# Patient Record
Sex: Male | Born: 1980
Health system: Southern US, Community
[De-identification: ages and names within clinical notes are randomized; demographics above are authoritative.]

## PROBLEM LIST (undated history)

## (undated) DIAGNOSIS — Q431 Hirschsprung's disease: Secondary | ICD-10-CM

## (undated) HISTORY — PX: HYDROCELE EXCISION: SHX482

## (undated) HISTORY — PX: WRIST SURGERY: SHX841

## (undated) HISTORY — PX: HAND SURGERY: SHX662

---

## 2017-12-28 ENCOUNTER — Other Ambulatory Visit: Payer: Self-pay | Admitting: Orthopedic Surgery

## 2017-12-28 DIAGNOSIS — M25562 Pain in left knee: Secondary | ICD-10-CM

## 2018-01-11 ENCOUNTER — Ambulatory Visit
Admission: RE | Admit: 2018-01-11 | Discharge: 2018-01-11 | Disposition: A | Discharge status: Home / Self Care | Payer: BLUE CROSS/BLUE SHIELD | Source: Ambulatory Visit | Attending: Orthopedic Surgery | Admitting: Orthopedic Surgery

## 2018-01-11 DIAGNOSIS — M25562 Pain in left knee: Secondary | ICD-10-CM

## 2020-03-23 ENCOUNTER — Emergency Department (HOSPITAL_COMMUNITY)
Admission: EM | Admit: 2020-03-23 | Discharge: 2020-03-23 | Disposition: A | Discharge status: Home / Self Care | Payer: BC Managed Care – PPO | Attending: Emergency Medicine | Admitting: Emergency Medicine

## 2020-03-23 ENCOUNTER — Encounter (HOSPITAL_COMMUNITY): Payer: Self-pay | Admitting: Emergency Medicine

## 2020-03-23 ENCOUNTER — Other Ambulatory Visit: Payer: Self-pay

## 2020-03-23 DIAGNOSIS — R2241 Localized swelling, mass and lump, right lower limb: Secondary | ICD-10-CM | POA: Diagnosis not present

## 2020-03-23 DIAGNOSIS — L03115 Cellulitis of right lower limb: Secondary | ICD-10-CM | POA: Insufficient documentation

## 2020-03-23 HISTORY — DX: Hirschsprung's disease: Q43.1

## 2020-03-23 MED ORDER — CLINDAMYCIN HCL 150 MG PO CAPS
300.0000 mg | ORAL_CAPSULE | Freq: Once | ORAL | Status: AC
  Administered 2020-03-23: 300 mg via ORAL
  Filled 2020-03-23: qty 2

## 2020-03-23 MED ORDER — CLINDAMYCIN HCL 300 MG PO CAPS: 300.0000 mg | ORAL_CAPSULE | Freq: Four times a day (QID) | ORAL | 0 refills | Status: AC

## 2020-03-23 NOTE — ED Triage Notes (Signed)
Pt complains of swelling, redness and pain to his right lower leg that began yesterday.

## 2020-03-23 NOTE — Discharge Instructions (Signed)
I have drawn a line on your leg, if the redness is rapidly spreading past this line or if you should develop increasing pain swelling fevers or any other severe or worsening symptoms return to the emergency department immediately.  We have given you the first dose of antibiotics here, please start taking the medication in the morning 4 times a day for the next 10 days.  If you should develop any worsening swelling or shortness of breath you will need to be evaluated for a blood clot however your examination this evening reveals that this is far more likely to be a skin infection from staph

## 2020-03-23 NOTE — ED Provider Notes (Signed)
[facility] EMERGENCY DEPARTMENT Provider Note   CSN: [id number] Arrival date & time: 03/23/20  2124     History Chief Complaint  Patient presents with   Leg Pain    Ronald Velasquez is a 39 y.o. male.  HPI   This patient is a 39 year old male, denies history of diabetes or immunocompromise.  He works as a IT trainer driving good distances frequently.  Last night he noticed a spot on his ankle which was red and painful, he started to have subjective fevers and chills and this morning woke up with progressive redness which by this evening is spread from his ankle up towards his knee.  He has pain with walking, pain with touching it, no associated nausea or vomiting.  He has not taken any specific medications for it, he was primarily worried about a blood clot  Past Medical History:  Diagnosis Date   Hirschsprung's disease     There are no problems to display for this patient.   Past Surgical History:  Procedure Laterality Date   HAND SURGERY Left    HYDROCELE EXCISION     WRIST SURGERY Left        History reviewed. No pertinent family history.  Social History   Tobacco Use   Smoking status: Never Smoker   Smokeless tobacco: Never Used  Vaping Use   Vaping Use: Never used  Substance Use Topics   Alcohol use: Not Currently   Drug use: Never    Home Medications Prior to Admission medications   Medication Sig Start Date End Date Taking? Authorizing Provider  clindamycin (CLEOCIN) 300 MG capsule Take 1 capsule (300 mg total) by mouth 4 (four) times daily for 10 days. May dispense as 150mg  capsules 03/23/20 04/02/20  04/04/20, MD    Allergies    Patient has no allergy information on record.  Review of Systems   Review of Systems  Constitutional: Positive for chills and fever.  Cardiovascular: Positive for leg swelling.  Skin: Positive for rash.  Neurological: Negative for weakness and numbness.    Physical Exam Updated Vital Signs BP 132/82 (BP  Location: Right Arm)    Pulse (!) 102    Temp 99.5 F (37.5 C) (Oral)    Resp 18    Ht 1.829 m (6')    Wt (!) 181.4 kg    SpO2 97%    BMI 54.25 kg/m   Physical Exam Vitals and nursing note reviewed.  Constitutional:      General: He is not in acute distress.    Appearance: He is well-developed.  HENT:     Head: Normocephalic and atraumatic.  Eyes:     General: No scleral icterus.       Right eye: No discharge.        Left eye: No discharge.     Conjunctiva/sclera: Conjunctivae normal.  Pulmonary:     Effort: Pulmonary effort is normal.  Musculoskeletal:     Right lower leg: Edema present.     Left lower leg: Edema present.  Skin:    Findings: Erythema and rash present.     Comments: Right lower extremity has mild erythema at the knee which darkens the lower you go on the leg towards the ankle where it is a darker red.  It is warm to the touch and mildly tender to the touch, normal pulses of the foot  Neurological:     Mental Status: He is alert.     Coordination: Coordination normal.  ED Results / Procedures / Treatments   Labs (all labs ordered are listed, but only abnormal results are displayed) Labs Reviewed - No data to display  EKG None  Radiology No results found.  Procedures Procedures (including critical care time)  Medications Ordered in ED Medications  clindamycin (CLEOCIN) capsule 300 mg (has no administration in time range)    ED Course  I have reviewed the triage vital signs and the nursing notes.  Pertinent labs & imaging results that were available during my care of the patient were reviewed by me and considered in my medical decision making (see chart for details).    MDM Rules/Calculators/A&P                          The patient is a borderline tachycardia, no objective fever, normal blood pressure.  He is not immunocompromise however he is obese and has edema predisposing him to cellulitis which I believe he has.  Though he is also at  risk of DVT I think that his fevers chills and redness is far more consistent with having cellulitis and we will treat him with clindamycin.  He understands that he will return for worsening symptoms and to follow-up for an ultrasound if no better.  The first dose of medicine given in the emergency department.  Final Clinical Impression(s) / ED Diagnoses Final diagnoses:  Cellulitis of right lower extremity    Rx / DC Orders ED Discharge Orders         Ordered    clindamycin (CLEOCIN) 300 MG capsule  4 times daily        03/23/20 2200           Eber Hong, MD 03/23/20 2201

## 2020-04-01 ENCOUNTER — Other Ambulatory Visit: Payer: Self-pay

## 2020-04-01 ENCOUNTER — Emergency Department (HOSPITAL_COMMUNITY)
Admission: EM | Admit: 2020-04-01 | Discharge: 2020-04-01 | Disposition: A | Discharge status: Home / Self Care | Payer: BC Managed Care – PPO | Attending: Emergency Medicine | Admitting: Emergency Medicine

## 2020-04-01 ENCOUNTER — Encounter (HOSPITAL_COMMUNITY): Payer: Self-pay | Admitting: *Deleted

## 2020-04-01 DIAGNOSIS — M7989 Other specified soft tissue disorders: Secondary | ICD-10-CM | POA: Insufficient documentation

## 2020-04-01 DIAGNOSIS — T7840XA Allergy, unspecified, initial encounter: Secondary | ICD-10-CM | POA: Insufficient documentation

## 2020-04-01 DIAGNOSIS — R600 Localized edema: Secondary | ICD-10-CM

## 2020-04-01 DIAGNOSIS — R21 Rash and other nonspecific skin eruption: Secondary | ICD-10-CM | POA: Diagnosis not present

## 2020-04-01 MED ORDER — PREDNISONE 50 MG PO TABS
60.0000 mg | ORAL_TABLET | Freq: Once | ORAL | Status: AC
  Administered 2020-04-01: 60 mg via ORAL
  Filled 2020-04-01: qty 1

## 2020-04-01 MED ORDER — PREDNISONE 10 MG PO TABS: 20.0000 mg | ORAL_TABLET | Freq: Every day | ORAL | 0 refills | Status: AC

## 2020-04-01 NOTE — ED Triage Notes (Addendum)
Pt c/o right lower leg swelling and redness that started on 03/22/20. Pt was seen at [facility] on 03/23/20 and diagnosed with staph and given antibiotics to take. Pt has taken antibiotics correctly and will finish them tomorrow. Pt reports an itchy, bump like rash started this morning on RLE and also on his chest. Pt reports the swelling and redness on his RLE is slightly better because it was all the way up to his knee.   Pt was seen at [facility] today and the provider wanted to do an [place] of his RLE to r/o DVT, but they did not have [place] machine available so they sent pt to ED for further evaluation.

## 2020-04-01 NOTE — ED Provider Notes (Signed)
[facility] EMERGENCY DEPARTMENT Provider Note   CSN: [id number] Arrival date & time: 04/01/20  1724     History Chief Complaint  Patient presents with  . Leg Swelling  . Rash    Ronald Velasquez is a 39 y.o. male.  Patient was seen on October 5 with cellulitis to right lower leg.  He has been taking clindamycin but has developed a rash to his leg his chest and his face.  He was seen at an urgent care and they sent him here for possible DVT  The history is provided by the patient and medical records. No language interpreter was used.  Rash Location: Face legs chest. Quality: itchiness   Severity:  Moderate Onset quality:  Sudden Timing:  Constant Progression:  Worsening Chronicity:  New Context: not animal contact   Associated symptoms: no abdominal pain, no diarrhea, no fatigue and no headaches        Past Medical History:  Diagnosis Date  . Hirschsprung's disease     There are no problems to display for this patient.   Past Surgical History:  Procedure Laterality Date  . HAND SURGERY Left   . HYDROCELE EXCISION    . WRIST SURGERY Left        No family history on file.  Social History   Tobacco Use  . Smoking status: Never Smoker  . Smokeless tobacco: Never Used  Vaping Use  . Vaping Use: Never used  Substance Use Topics  . Alcohol use: Not Currently  . Drug use: Never    Home Medications Prior to Admission medications   Medication Sig Start Date End Date Taking? Authorizing Provider  clindamycin (CLEOCIN) 300 MG capsule Take 1 capsule (300 mg total) by mouth 4 (four) times daily for 10 days. May dispense as 150mg  capsules 03/23/20 04/02/20  04/04/20, MD  predniSONE (DELTASONE) 10 MG tablet Take 2 tablets (20 mg total) by mouth daily. 04/01/20   04/03/20, MD    Allergies    Patient has no known allergies.  Review of Systems   Review of Systems  Constitutional: Negative for appetite change and fatigue.  HENT: Negative for  congestion, ear discharge and sinus pressure.   Eyes: Negative for discharge.  Respiratory: Negative for cough.   Cardiovascular: Negative for chest pain.  Gastrointestinal: Negative for abdominal pain and diarrhea.  Genitourinary: Negative for frequency and hematuria.  Musculoskeletal: Negative for back pain.  Skin: Positive for rash.  Neurological: Negative for seizures and headaches.  Psychiatric/Behavioral: Negative for hallucinations.    Physical Exam Updated Vital Signs BP 119/77 (BP Location: Right Arm)   Pulse 81   Temp 98.3 F (36.8 C) (Oral)   Resp 18   Ht 6' (1.829 m)   Wt (!) 181.4 kg   SpO2 97%   BMI 54.25 kg/m   Physical Exam Vitals and nursing note reviewed.  Constitutional:      Appearance: He is well-developed.  HENT:     Head: Normocephalic.     Nose: Nose normal.  Eyes:     General: No scleral icterus.    Conjunctiva/sclera: Conjunctivae normal.  Neck:     Thyroid: No thyromegaly.  Cardiovascular:     Rate and Rhythm: Normal rate and regular rhythm.     Heart sounds: No murmur heard.  No friction rub. No gallop.   Pulmonary:     Breath sounds: No stridor. No wheezing or rales.  Chest:     Chest wall: No tenderness.  Abdominal:  General: There is no distension.     Tenderness: There is no abdominal tenderness. There is no rebound.  Musculoskeletal:     Cervical back: Neck supple.     Comments: Swelling mild tenderness to The Right Leg  Lymphadenopathy:     Cervical: No cervical adenopathy.  Skin:    Findings: Rash present. No erythema.     Comments: Patient has a rash to his face his chest and his right leg.  It appears macular papular and possible allergic  Neurological:     Mental Status: He is alert and oriented to person, place, and time.     Motor: No abnormal muscle tone.     Coordination: Coordination normal.  Psychiatric:        Behavior: Behavior normal.     ED Results / Procedures / Treatments   Labs (all labs ordered  are listed, but only abnormal results are displayed) Labs Reviewed - No data to display  EKG None  Radiology No results found.  Procedures Procedures (including critical care time)  Medications Ordered in ED Medications  predniSONE (DELTASONE) tablet 60 mg (has no administration in time range)    ED Course  I have reviewed the triage vital signs and the nursing notes.  Pertinent labs & imaging results that were available during my care of the patient were reviewed by me and considered in my medical decision making (see chart for details).    MDM Rules/Calculators/A&P                          Patient with allergic reaction to his clindamycin.  He also has swelling to his right leg but it appears that the cellulitis has improved.  He will stop the clindamycin which he only had 1 day left on and he is placed on prednisone for a few days and will get an ultrasound to rule out DVT tomorrow      This patient presents to the ED for concern of rash, this involves an extensive number of treatment options, and is a complaint that carries with it a high risk of complications and morbidity.  The differential diagnosis includes allergic reaction   Lab Tests:   Medicines ordered:   I ordered medication prednisone  Imaging Studies ordered:   Patient get an ultrasound of the right lower leg rule out DVT  Additional history obtained:   Additional history obtained from records  Previous records obtained and reviewed.  Consultations Obtained:    findings  Reevaluation:  After the interventions stated above, I reevaluated the patient and found no change  Critical Interventions:  .   Final Clinical Impression(s) / ED Diagnoses Final diagnoses:  Leg swelling  Allergic reaction, initial encounter    Rx / DC Orders ED Discharge Orders         Ordered    predniSONE (DELTASONE) 10 MG tablet  Daily        04/01/20 1810    US Venous Img Lower Unilateral Right         04/01/20 1811           Bethann Berkshire, MD 04/01/20 1818

## 2020-04-01 NOTE — Discharge Instructions (Addendum)
Radiology department will contact you to have you come back and get an ultrasound of your leg.  Keep your leg elevated stop taking the clindamycin it appears you are allergic to it.  We will place you on prednisone.  After you have your ultrasound done,  the Doctor who discusses the results with you can decide whether you need antibiotics again

## 2020-04-02 ENCOUNTER — Emergency Department (HOSPITAL_COMMUNITY)
Admission: RE | Admit: 2020-04-02 | Discharge: 2020-04-02 | Disposition: A | Discharge status: Home / Self Care | Payer: BC Managed Care – PPO | Source: Ambulatory Visit | Attending: Emergency Medicine | Admitting: Emergency Medicine

## 2020-04-02 DIAGNOSIS — M7989 Other specified soft tissue disorders: Secondary | ICD-10-CM | POA: Diagnosis not present

## 2020-04-02 DIAGNOSIS — R21 Rash and other nonspecific skin eruption: Secondary | ICD-10-CM | POA: Diagnosis not present

## 2020-04-02 DIAGNOSIS — R600 Localized edema: Secondary | ICD-10-CM

## 2020-04-02 NOTE — ED Provider Notes (Signed)
Blood pressure 119/77, pulse 81, temperature 98.3 F (36.8 C), temperature source Oral, resp. rate 18, height 6' (1.829 m), weight (!) 181.4 kg, SpO2 97 %.  In short, Ronald Velasquez is a 39 y.o. male with a chief complaint of Leg Swelling and Rash .  Refer to the original H&P for additional details.  Patient returns this morning for ultrasound of the right lower extremity.  I have reviewed the radiology interpretation which is negative for DVT.  Patient to be discharged with plan per prior EDP last night.     Maia Plan, MD 04/02/20 1046

## 2020-04-02 NOTE — ED Notes (Addendum)
EDP went to talk with pt on 04/02/20 and not found in waiting room. Pt called and informed of negative ultrasound result at 1243.

## 2020-04-27 DIAGNOSIS — R609 Edema, unspecified: Secondary | ICD-10-CM | POA: Diagnosis not present

## 2020-12-24 DIAGNOSIS — L0390 Cellulitis, unspecified: Secondary | ICD-10-CM | POA: Diagnosis not present

## 2020-12-27 DIAGNOSIS — L03115 Cellulitis of right lower limb: Secondary | ICD-10-CM | POA: Diagnosis not present

## 2021-01-13 ENCOUNTER — Other Ambulatory Visit (INDEPENDENT_AMBULATORY_CARE_PROVIDER_SITE_OTHER): Payer: Self-pay | Admitting: Nurse Practitioner

## 2021-01-13 DIAGNOSIS — L03115 Cellulitis of right lower limb: Secondary | ICD-10-CM | POA: Diagnosis not present

## 2021-01-13 DIAGNOSIS — L03116 Cellulitis of left lower limb: Secondary | ICD-10-CM

## 2021-01-13 DIAGNOSIS — M7989 Other specified soft tissue disorders: Secondary | ICD-10-CM

## 2021-01-14 ENCOUNTER — Encounter (INDEPENDENT_AMBULATORY_CARE_PROVIDER_SITE_OTHER): Payer: Self-pay | Admitting: Nurse Practitioner

## 2021-01-14 ENCOUNTER — Ambulatory Visit (INDEPENDENT_AMBULATORY_CARE_PROVIDER_SITE_OTHER): Payer: BC Managed Care – PPO

## 2021-01-14 ENCOUNTER — Ambulatory Visit (INDEPENDENT_AMBULATORY_CARE_PROVIDER_SITE_OTHER): Payer: BC Managed Care – PPO | Admitting: Nurse Practitioner

## 2021-01-14 ENCOUNTER — Other Ambulatory Visit: Payer: Self-pay

## 2021-01-14 VITALS — BP 116/81 | HR 85 | Resp 16 | Ht 72.0 in | Wt >= 6400 oz

## 2021-01-14 DIAGNOSIS — I8311 Varicose veins of right lower extremity with inflammation: Secondary | ICD-10-CM | POA: Diagnosis not present

## 2021-01-14 DIAGNOSIS — R600 Localized edema: Secondary | ICD-10-CM

## 2021-01-14 DIAGNOSIS — L03115 Cellulitis of right lower limb: Secondary | ICD-10-CM

## 2021-01-14 DIAGNOSIS — M7989 Other specified soft tissue disorders: Secondary | ICD-10-CM

## 2021-01-14 DIAGNOSIS — I890 Lymphedema, not elsewhere classified: Secondary | ICD-10-CM

## 2021-01-14 NOTE — Progress Notes (Signed)
Subjective:    Patient ID: Ronald Velasquez, male    DOB: 06-Dec-1980, 40 y.o.   MRN: [id number] Chief Complaint  Patient presents with   Follow-up    ultrasound   New Patient (Initial Visit)    Ultrasound recurrent swelling    Ronald Velasquez is a 40 year old male that presents today as a referral from [place], NP due to recurrent right lower extremity leg swelling and cellulitis.  The patient notes that the leg swelling and cellulitis began about a year and a half ago.  Since that time he has had multiple recurrences.  The most recent episode was last Wednesday.  He describes some burning discomfort in his lower extremity in addition to worsening redness and swelling.  Even before the cellulitis episodes he had been having ongoing swelling in his right lower extremity.  He recently began wearing medical grade compression stockings and they have helped with the swelling somewhat but they have not seem to prevent this recurrence of cellulitis.  The patient does have a previous history of ankle injuries.  He denies any fevers or chills.  There are no open wounds or ulcerations.  Today noninvasive studies show no evidence of DVT or superficial thrombophlebitis.  There is no evidence of superficial venous reflux in the great saphenous vein.  There is no evidence of deep venous insufficiency.  The patient does have reflux in the small saphenous vein.  It is also noted that there are multiple enlarged lymph nodes in the right groin area   Review of Systems  Cardiovascular:  Positive for leg swelling.  Skin:  Positive for color change.  All other systems reviewed and are negative.     Objective:   Physical Exam Vitals reviewed.  Constitutional:      Appearance: He is obese.  HENT:     Head: Normocephalic.  Cardiovascular:     Rate and Rhythm: Normal rate.  Pulmonary:     Effort: Pulmonary effort is normal.  Musculoskeletal:     Right lower leg: 2+ Edema present.     Left lower leg: 1+ Edema  present.  Skin:      Neurological:     Mental Status: He is alert and oriented to person, place, and time.  Psychiatric:        Mood and Affect: Mood normal.        Behavior: Behavior normal.        Thought Content: Thought content normal.        Judgment: Judgment normal.   BP 116/81 (BP Location: Right Arm)   Pulse 85   Resp 16   Ht 6' (1.829 m)   Wt (!) 406 lb (184.2 kg)   BMI 55.06 kg/m   Past Medical History:  Diagnosis Date   Hirschsprung's disease     Social History   Socioeconomic History   Marital status: Single    Spouse name: Not on file   Number of children: Not on file   Years of education: Not on file   Highest education level: Not on file  Occupational History   Not on file  Tobacco Use   Smoking status: Never   Smokeless tobacco: Never  Vaping Use   Vaping Use: Never used  Substance and Sexual Activity   Alcohol use: Not Currently   Drug use: Never   Sexual activity: Not on file  Other Topics Concern   Not on file  Social History Narrative   Not on file   Social  Determinants of Health   Financial Resource Strain: Not on file  Food Insecurity: Not on file  Transportation Needs: Not on file  Physical Activity: Not on file  Stress: Not on file  Social Connections: Not on file  Intimate Partner Violence: Not on file    Past Surgical History:  Procedure Laterality Date   HAND SURGERY Left    HYDROCELE EXCISION     WRIST SURGERY Left     Family History  Problem Relation Age of Onset   Heart attack Mother    Diabetes Maternal Grandmother     Allergies  Allergen Reactions   Clindamycin Rash    No flowsheet data found.    CMP  No results found for: NA, K, CL, CO2, GLUCOSE, BUN, CREATININE, CALCIUM, PROT, ALBUMIN, AST, ALT, ALKPHOS, BILITOT, GFRNONAA, GFRAA   No results found.     Assessment & Plan:   1. Varicose veins of right lower extremity with inflammation The patient does have evidence of superficial venous reflux  in his small saphenous vein.  The venous reflux may be worsening the swelling but a larger component may be the underlying lymphedema due to recurrent cellulitis and edema.  We will try to get the patient swelling under control for discussing possible intervention or if it would be of use.  2. Lymphedema I suspect that a large part of the patient's recurrent cellulitis is underlying lymphedema from longstanding issues with swelling.  In order to try to help heal the cellulitis as well as reduce the patient's leg swelling we will place him in Unna wraps today.  These wraps to be changed on a weekly basis drainage permitting.  We will plan on reevaluating his progress with the swelling and cellulitis in 4 weeks.  In the interim the patient is advised to elevate his lower extremities.  Ideally it would be above heart level but at least level with the ground.  The patient should also try to achieve 20 to 30 minutes of activity at least 4 days a week to help with swelling as well.  Following the patient's wound wraps we will evaluate progress of swelling and determine if a lymphedema pump may be helpful to the patient as well.   Current Outpatient Medications on File Prior to Visit  Medication Sig Dispense Refill   predniSONE (DELTASONE) 10 MG tablet Take 2 tablets (20 mg total) by mouth daily. (Patient not taking: Reported on 01/14/2021) 10 tablet 0   No current facility-administered medications on file prior to visit.    There are no Patient Instructions on file for this visit. No follow-ups on file.   Georgiana Spinner, NP

## 2021-01-20 DIAGNOSIS — Z79899 Other long term (current) drug therapy: Secondary | ICD-10-CM | POA: Diagnosis not present

## 2021-01-20 DIAGNOSIS — Z13220 Encounter for screening for lipoid disorders: Secondary | ICD-10-CM | POA: Diagnosis not present

## 2021-01-20 DIAGNOSIS — Z131 Encounter for screening for diabetes mellitus: Secondary | ICD-10-CM | POA: Diagnosis not present

## 2021-01-21 ENCOUNTER — Ambulatory Visit (INDEPENDENT_AMBULATORY_CARE_PROVIDER_SITE_OTHER): Payer: BC Managed Care – PPO | Admitting: Nurse Practitioner

## 2021-01-21 ENCOUNTER — Other Ambulatory Visit: Payer: Self-pay

## 2021-01-21 VITALS — BP 118/81 | HR 73 | Ht 72.0 in | Wt >= 6400 oz

## 2021-01-21 DIAGNOSIS — I890 Lymphedema, not elsewhere classified: Secondary | ICD-10-CM

## 2021-01-21 NOTE — Progress Notes (Signed)
History of Present Illness  There is no documented history at this time  Assessments & Plan   There are no diagnoses linked to this encounter.    Additional instructions  Subjective:  Patient presents with venous ulcer of the Right lower extremity.    Procedure:  3 layer unna wrap was placed Right lower extremity.   Plan:   Follow up in one week.   

## 2021-01-28 ENCOUNTER — Encounter (INDEPENDENT_AMBULATORY_CARE_PROVIDER_SITE_OTHER): Payer: Self-pay

## 2021-01-28 ENCOUNTER — Other Ambulatory Visit: Payer: Self-pay

## 2021-01-28 ENCOUNTER — Ambulatory Visit (INDEPENDENT_AMBULATORY_CARE_PROVIDER_SITE_OTHER): Payer: BC Managed Care – PPO | Admitting: Nurse Practitioner

## 2021-01-28 VITALS — BP 142/86 | HR 76 | Resp 16 | Wt >= 6400 oz

## 2021-01-28 DIAGNOSIS — L03115 Cellulitis of right lower limb: Secondary | ICD-10-CM

## 2021-01-28 NOTE — Progress Notes (Signed)
History of Present Illness  There is no documented history at this time  Assessments & Plan   There are no diagnoses linked to this encounter.    Additional instructions  Subjective:  Patient presents with venous ulcer of the Right lower extremity.    Procedure:  3 layer unna wrap was placed Right lower extremity.   Plan:   Follow up in one week.   

## 2021-01-29 ENCOUNTER — Encounter (INDEPENDENT_AMBULATORY_CARE_PROVIDER_SITE_OTHER): Payer: Self-pay | Admitting: Nurse Practitioner

## 2021-01-30 ENCOUNTER — Encounter (INDEPENDENT_AMBULATORY_CARE_PROVIDER_SITE_OTHER): Payer: Self-pay | Admitting: Nurse Practitioner

## 2021-02-02 DIAGNOSIS — R0789 Other chest pain: Secondary | ICD-10-CM | POA: Diagnosis not present

## 2021-02-02 DIAGNOSIS — Z713 Dietary counseling and surveillance: Secondary | ICD-10-CM | POA: Diagnosis not present

## 2021-02-02 DIAGNOSIS — Z6841 Body Mass Index (BMI) 40.0 and over, adult: Secondary | ICD-10-CM | POA: Diagnosis not present

## 2021-02-02 DIAGNOSIS — R030 Elevated blood-pressure reading, without diagnosis of hypertension: Secondary | ICD-10-CM | POA: Diagnosis not present

## 2021-02-04 ENCOUNTER — Encounter (INDEPENDENT_AMBULATORY_CARE_PROVIDER_SITE_OTHER): Payer: Self-pay | Admitting: Nurse Practitioner

## 2021-02-04 ENCOUNTER — Ambulatory Visit (INDEPENDENT_AMBULATORY_CARE_PROVIDER_SITE_OTHER): Payer: BC Managed Care – PPO | Admitting: Nurse Practitioner

## 2021-02-04 ENCOUNTER — Other Ambulatory Visit: Payer: Self-pay

## 2021-02-04 VITALS — BP 117/81 | HR 76 | Ht 72.0 in | Wt >= 6400 oz

## 2021-02-04 DIAGNOSIS — I890 Lymphedema, not elsewhere classified: Secondary | ICD-10-CM

## 2021-02-04 NOTE — Progress Notes (Signed)
History of Present Illness  There is no documented history at this time  Assessments & Plan   There are no diagnoses linked to this encounter.    Additional instructions  Subjective:  Patient presents with venous ulcer of the Right lower extremity.    Procedure:  3 layer unna wrap was placed Right lower extremity.   Plan:   Follow up in one week.   

## 2021-02-11 ENCOUNTER — Ambulatory Visit (INDEPENDENT_AMBULATORY_CARE_PROVIDER_SITE_OTHER): Payer: BC Managed Care – PPO | Admitting: Nurse Practitioner

## 2021-02-11 ENCOUNTER — Encounter (INDEPENDENT_AMBULATORY_CARE_PROVIDER_SITE_OTHER): Payer: Self-pay | Admitting: Nurse Practitioner

## 2021-02-11 ENCOUNTER — Other Ambulatory Visit: Payer: Self-pay

## 2021-02-11 VITALS — BP 126/80 | HR 83 | Resp 16 | Wt >= 6400 oz

## 2021-02-11 DIAGNOSIS — L03115 Cellulitis of right lower limb: Secondary | ICD-10-CM | POA: Diagnosis not present

## 2021-02-11 DIAGNOSIS — I890 Lymphedema, not elsewhere classified: Secondary | ICD-10-CM | POA: Diagnosis not present

## 2021-02-12 ENCOUNTER — Encounter (INDEPENDENT_AMBULATORY_CARE_PROVIDER_SITE_OTHER): Payer: Self-pay | Admitting: Nurse Practitioner

## 2021-02-12 NOTE — Progress Notes (Signed)
Subjective:    Patient ID: Ronald Velasquez, male    DOB: 1980/06/30, 40 y.o.   MRN: [id number] Chief Complaint  Patient presents with   Follow-up    Ronald Velasquez boot check    Ronald Velasquez is a 40 year old man that returns to the office for followup evaluation regarding leg swelling.  The swelling has improved quite a bit and the pain associated with swelling has decreased substantially. There have not been any interval development of a ulcerations or wounds.  Since the previous visit the patient has been in Unna wraps and this has been very helpful for his swelling and gaining control. The patient also states elevation during the day and exercise is being done too.        Review of Systems  Cardiovascular:  Positive for leg swelling.  Skin:  Negative for wound.  All other systems reviewed and are negative.     Objective:   Physical Exam Vitals reviewed.  HENT:     Head: Normocephalic.  Cardiovascular:     Rate and Rhythm: Normal rate.  Pulmonary:     Effort: Pulmonary effort is normal.  Musculoskeletal:     Right lower leg: No edema.     Left lower leg: No edema.  Skin:    Findings: No erythema.  Neurological:     Mental Status: He is alert and oriented to person, place, and time.  Psychiatric:        Mood and Affect: Mood normal.        Behavior: Behavior normal.        Thought Content: Thought content normal.        Judgment: Judgment normal.    BP 126/80 (BP Location: Right Arm)   Pulse 83   Resp 16   Wt (!) 412 lb (186.9 kg)   BMI 55.88 kg/m   Past Medical History:  Diagnosis Date   Hirschsprung's disease     Social History   Socioeconomic History   Marital status: Single    Spouse name: Not on file   Number of children: Not on file   Years of education: Not on file   Highest education level: Not on file  Occupational History   Not on file  Tobacco Use   Smoking status: Never   Smokeless tobacco: Never  Vaping Use   Vaping Use: Never used   Substance and Sexual Activity   Alcohol use: Not Currently   Drug use: Never   Sexual activity: Not on file  Other Topics Concern   Not on file  Social History Narrative   Not on file   Social Determinants of Health   Financial Resource Strain: Not on file  Food Insecurity: Not on file  Transportation Needs: Not on file  Physical Activity: Not on file  Stress: Not on file  Social Connections: Not on file  Intimate Partner Violence: Not on file    Past Surgical History:  Procedure Laterality Date   HAND SURGERY Left    HYDROCELE EXCISION     WRIST SURGERY Left     Family History  Problem Relation Age of Onset   Heart attack Mother    Diabetes Maternal Grandmother     Allergies  Allergen Reactions   Clindamycin Rash    No flowsheet data found.    CMP  No results found for: NA, K, CL, CO2, GLUCOSE, BUN, CREATININE, CALCIUM, PROT, ALBUMIN, AST, ALT, ALKPHOS, BILITOT, GFRNONAA, GFRAA   No results found.  Assessment & Plan:   1. Lymphedema The patient has done well with Unna wraps.  The swelling is greatly under control and his cellulitis has resolved.  The patient is advised to transition to conservative therapy including use of medical grade compression stockings, elevation and activity.  He is advised to utilize 20 to 30 mmHg compression socks and he should place the socks daily in the morning and remove them at bedtime.  He should not sleep in the socks.  Consideration for a lymph pump will also be made based upon the effectiveness of conservative therapy.  This would help to improve the edema control and prevent sequela such as ulcers and infections   The patient will follow-up in 3 months or sooner if issues should arise. The patient will follow-up with me after the ultrasound.    2. Cellulitis of leg, right This has resolved   Current Outpatient Medications on File Prior to Visit  Medication Sig Dispense Refill   predniSONE (DELTASONE) 10 MG  tablet Take 2 tablets (20 mg total) by mouth daily. (Patient not taking: No sig reported) 10 tablet 0   No current facility-administered medications on file prior to visit.    There are no Patient Instructions on file for this visit. No follow-ups on file.   Georgiana Spinner, NP

## 2021-02-17 DIAGNOSIS — Z7689 Persons encountering health services in other specified circumstances: Secondary | ICD-10-CM | POA: Diagnosis not present

## 2021-03-15 DIAGNOSIS — M6281 Muscle weakness (generalized): Secondary | ICD-10-CM | POA: Diagnosis not present

## 2021-03-15 DIAGNOSIS — Z713 Dietary counseling and surveillance: Secondary | ICD-10-CM | POA: Diagnosis not present

## 2021-03-15 DIAGNOSIS — E669 Obesity, unspecified: Secondary | ICD-10-CM | POA: Diagnosis not present

## 2021-03-15 DIAGNOSIS — R29898 Other symptoms and signs involving the musculoskeletal system: Secondary | ICD-10-CM | POA: Diagnosis not present

## 2021-03-15 DIAGNOSIS — Z6841 Body Mass Index (BMI) 40.0 and over, adult: Secondary | ICD-10-CM | POA: Diagnosis not present

## 2021-03-15 DIAGNOSIS — F4320 Adjustment disorder, unspecified: Secondary | ICD-10-CM | POA: Diagnosis not present

## 2021-05-10 DIAGNOSIS — G4733 Obstructive sleep apnea (adult) (pediatric): Secondary | ICD-10-CM | POA: Diagnosis not present

## 2021-05-17 DIAGNOSIS — G4733 Obstructive sleep apnea (adult) (pediatric): Secondary | ICD-10-CM | POA: Diagnosis not present

## 2021-06-01 DIAGNOSIS — Z01818 Encounter for other preprocedural examination: Secondary | ICD-10-CM | POA: Diagnosis not present

## 2021-06-01 DIAGNOSIS — Z6841 Body Mass Index (BMI) 40.0 and over, adult: Secondary | ICD-10-CM | POA: Diagnosis not present

## 2021-06-06 DIAGNOSIS — Z6841 Body Mass Index (BMI) 40.0 and over, adult: Secondary | ICD-10-CM | POA: Diagnosis not present

## 2021-06-15 DIAGNOSIS — G4733 Obstructive sleep apnea (adult) (pediatric): Secondary | ICD-10-CM | POA: Diagnosis not present

## 2021-07-16 DIAGNOSIS — G4733 Obstructive sleep apnea (adult) (pediatric): Secondary | ICD-10-CM | POA: Diagnosis not present

## 2021-07-20 DIAGNOSIS — Z7689 Persons encountering health services in other specified circumstances: Secondary | ICD-10-CM | POA: Diagnosis not present

## 2021-07-20 DIAGNOSIS — Z01818 Encounter for other preprocedural examination: Secondary | ICD-10-CM | POA: Diagnosis not present

## 2021-08-02 DIAGNOSIS — Z713 Dietary counseling and surveillance: Secondary | ICD-10-CM | POA: Diagnosis not present

## 2021-08-02 DIAGNOSIS — Z6841 Body Mass Index (BMI) 40.0 and over, adult: Secondary | ICD-10-CM | POA: Diagnosis not present

## 2021-08-16 DIAGNOSIS — G4733 Obstructive sleep apnea (adult) (pediatric): Secondary | ICD-10-CM | POA: Diagnosis not present

## 2021-08-26 DIAGNOSIS — E538 Deficiency of other specified B group vitamins: Secondary | ICD-10-CM | POA: Diagnosis not present

## 2021-08-26 DIAGNOSIS — R7989 Other specified abnormal findings of blood chemistry: Secondary | ICD-10-CM | POA: Diagnosis not present

## 2021-08-30 DIAGNOSIS — Z87891 Personal history of nicotine dependence: Secondary | ICD-10-CM | POA: Diagnosis not present

## 2021-08-30 DIAGNOSIS — Z6841 Body Mass Index (BMI) 40.0 and over, adult: Secondary | ICD-10-CM | POA: Diagnosis not present

## 2021-09-13 DIAGNOSIS — G4733 Obstructive sleep apnea (adult) (pediatric): Secondary | ICD-10-CM | POA: Diagnosis not present

## 2021-10-14 DIAGNOSIS — G4733 Obstructive sleep apnea (adult) (pediatric): Secondary | ICD-10-CM | POA: Diagnosis not present

## 2021-10-25 DIAGNOSIS — Z6740 Type O blood, Rh positive: Secondary | ICD-10-CM | POA: Diagnosis not present

## 2021-10-25 DIAGNOSIS — Z87738 Personal history of other specified (corrected) congenital malformations of digestive system: Secondary | ICD-10-CM | POA: Diagnosis not present

## 2021-10-25 DIAGNOSIS — Z7901 Long term (current) use of anticoagulants: Secondary | ICD-10-CM | POA: Diagnosis not present

## 2021-10-25 DIAGNOSIS — R7303 Prediabetes: Secondary | ICD-10-CM | POA: Diagnosis not present

## 2021-10-25 DIAGNOSIS — Z79899 Other long term (current) drug therapy: Secondary | ICD-10-CM | POA: Diagnosis not present

## 2021-10-25 DIAGNOSIS — Z713 Dietary counseling and surveillance: Secondary | ICD-10-CM | POA: Diagnosis not present

## 2021-10-25 DIAGNOSIS — G4733 Obstructive sleep apnea (adult) (pediatric): Secondary | ICD-10-CM | POA: Diagnosis not present

## 2021-10-25 DIAGNOSIS — Z6841 Body Mass Index (BMI) 40.0 and over, adult: Secondary | ICD-10-CM | POA: Diagnosis not present

## 2021-10-25 DIAGNOSIS — E559 Vitamin D deficiency, unspecified: Secondary | ICD-10-CM | POA: Diagnosis not present

## 2021-10-25 DIAGNOSIS — Z48815 Encounter for surgical aftercare following surgery on the digestive system: Secondary | ICD-10-CM | POA: Diagnosis not present

## 2021-10-25 DIAGNOSIS — Z01818 Encounter for other preprocedural examination: Secondary | ICD-10-CM | POA: Diagnosis not present

## 2021-10-25 DIAGNOSIS — Z9884 Bariatric surgery status: Secondary | ICD-10-CM | POA: Diagnosis not present

## 2021-11-10 DIAGNOSIS — Z6841 Body Mass Index (BMI) 40.0 and over, adult: Secondary | ICD-10-CM | POA: Diagnosis not present

## 2021-11-10 DIAGNOSIS — G4733 Obstructive sleep apnea (adult) (pediatric): Secondary | ICD-10-CM | POA: Diagnosis not present

## 2021-11-10 DIAGNOSIS — I10 Essential (primary) hypertension: Secondary | ICD-10-CM | POA: Diagnosis not present

## 2021-11-10 DIAGNOSIS — Z79899 Other long term (current) drug therapy: Secondary | ICD-10-CM | POA: Diagnosis not present

## 2021-11-10 DIAGNOSIS — K660 Peritoneal adhesions (postprocedural) (postinfection): Secondary | ICD-10-CM | POA: Diagnosis not present

## 2021-11-10 DIAGNOSIS — E559 Vitamin D deficiency, unspecified: Secondary | ICD-10-CM | POA: Diagnosis not present

## 2021-11-10 DIAGNOSIS — Z87891 Personal history of nicotine dependence: Secondary | ICD-10-CM | POA: Diagnosis not present

## 2021-11-10 DIAGNOSIS — Z538 Procedure and treatment not carried out for other reasons: Secondary | ICD-10-CM | POA: Diagnosis not present

## 2021-11-10 DIAGNOSIS — Z881 Allergy status to other antibiotic agents status: Secondary | ICD-10-CM | POA: Diagnosis not present

## 2021-11-10 DIAGNOSIS — Z933 Colostomy status: Secondary | ICD-10-CM | POA: Diagnosis not present

## 2021-11-13 DIAGNOSIS — G4733 Obstructive sleep apnea (adult) (pediatric): Secondary | ICD-10-CM | POA: Diagnosis not present

## 2021-12-07 DIAGNOSIS — Z6841 Body Mass Index (BMI) 40.0 and over, adult: Secondary | ICD-10-CM | POA: Diagnosis not present

## 2021-12-07 DIAGNOSIS — Z713 Dietary counseling and surveillance: Secondary | ICD-10-CM | POA: Diagnosis not present

## 2021-12-14 DIAGNOSIS — G4733 Obstructive sleep apnea (adult) (pediatric): Secondary | ICD-10-CM | POA: Diagnosis not present

## 2022-01-06 IMAGING — US US EXTREM LOW VENOUS*R*
1 series · 14 of 24 positions shown · non-contrast
Comparison: None.

CLINICAL DATA: Right leg swelling and rash for the past week.

EXAM:
RIGHT LOWER EXTREMITY VENOUS DOPPLER ULTRASOUND
TECHNIQUE: Gray-scale sonography with compression, as well as color and duplex
ultrasound, were performed to evaluate the deep venous system(s)
from the level of the common femoral vein through the popliteal and
proximal calf veins.

[Series 1: us venous img lower uni right (dvt) · portal-venous · 14 of 40 slices shown]
[im 1/40]
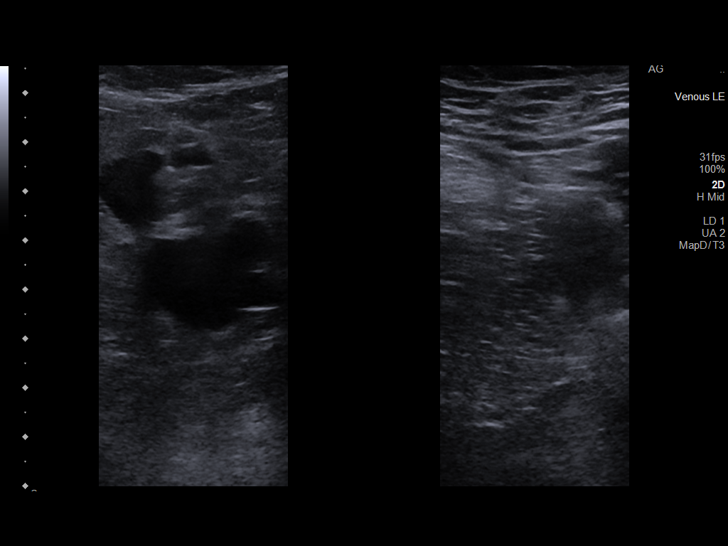
[im 4/40]
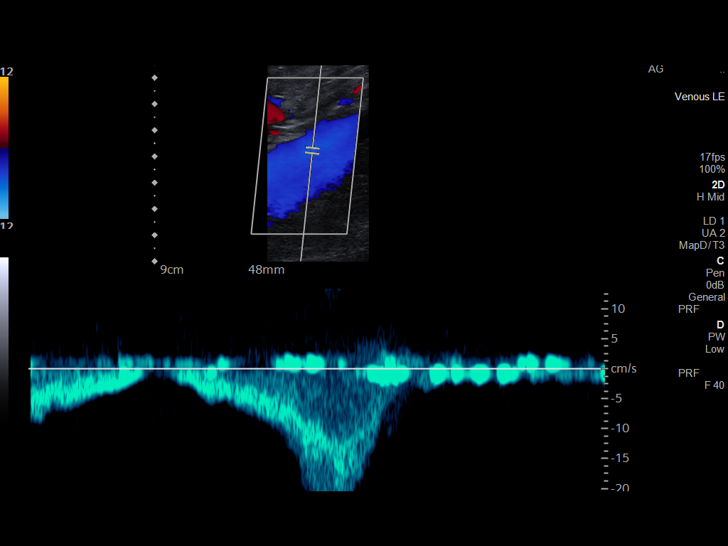
[im 7/40]
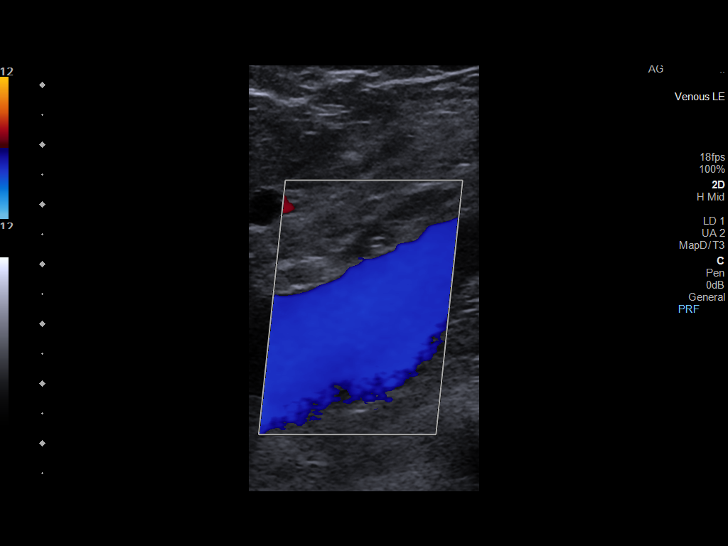
[im 11/40]
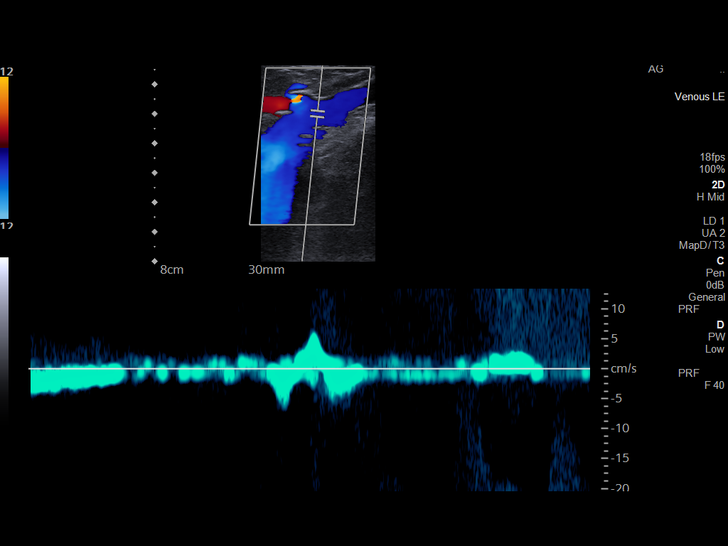
[im 12/40]
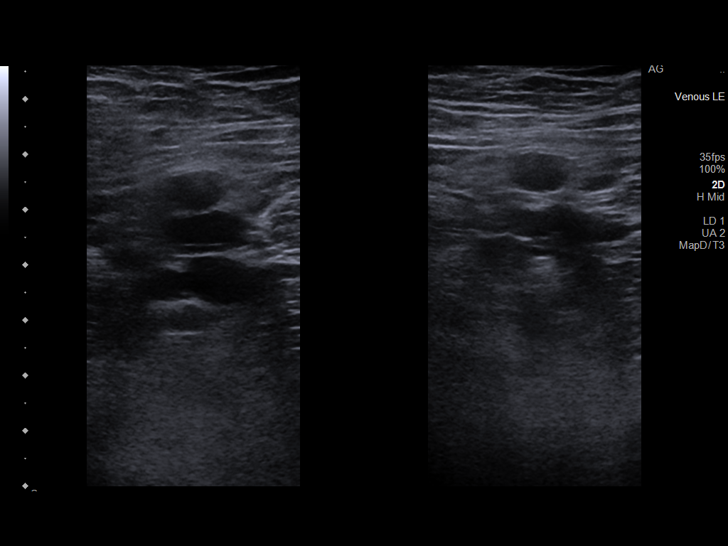
[im 16/40]
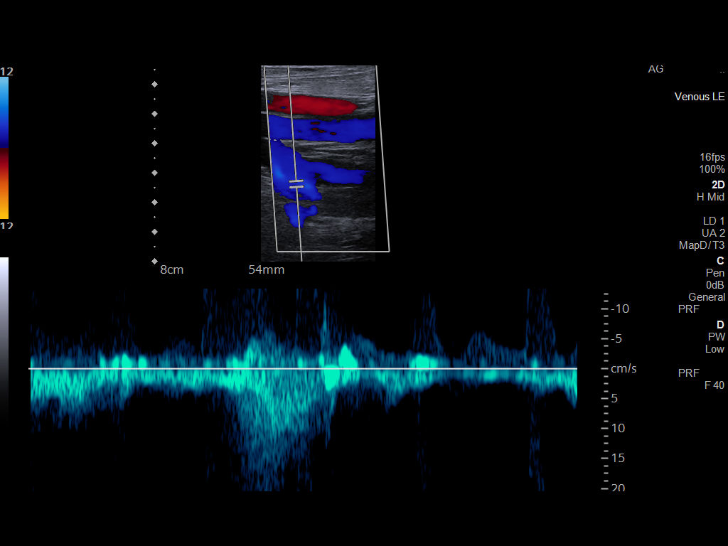
[im 19/40]
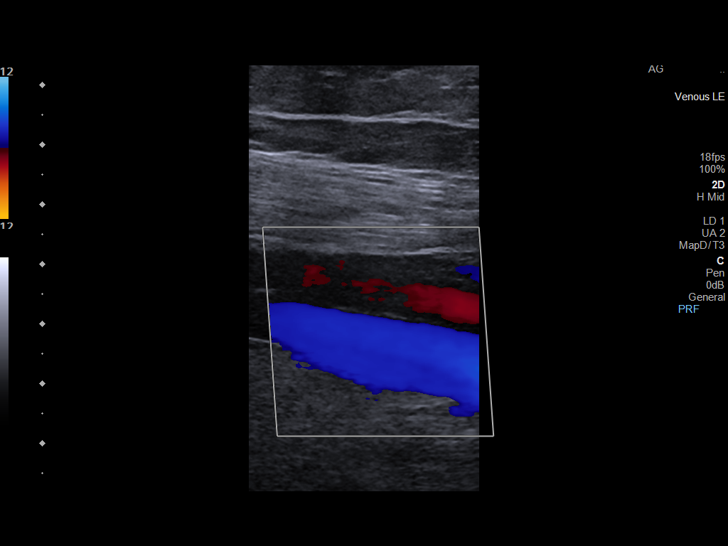
[im 21/40]
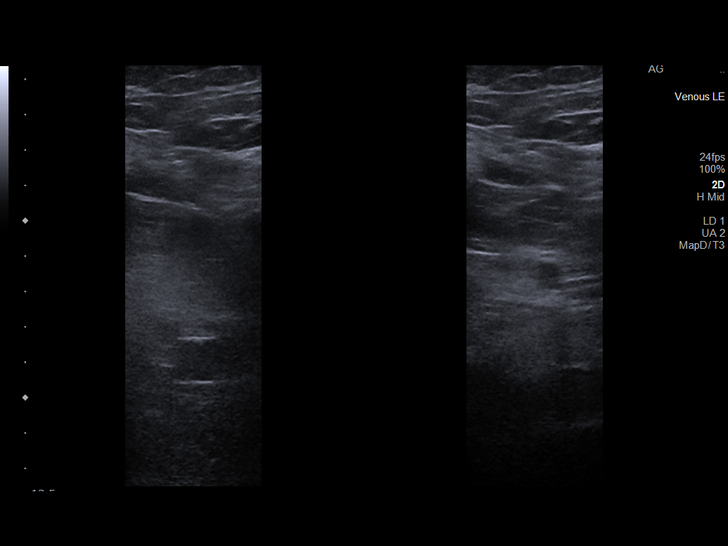
[im 24/40]
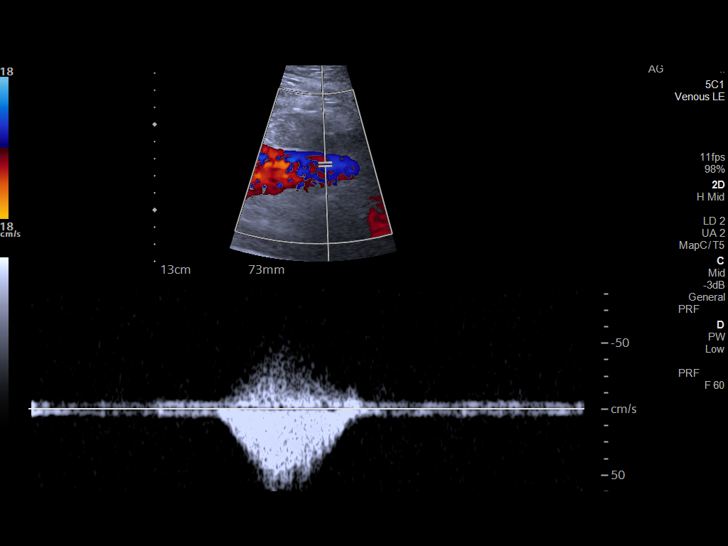
[im 28/40]
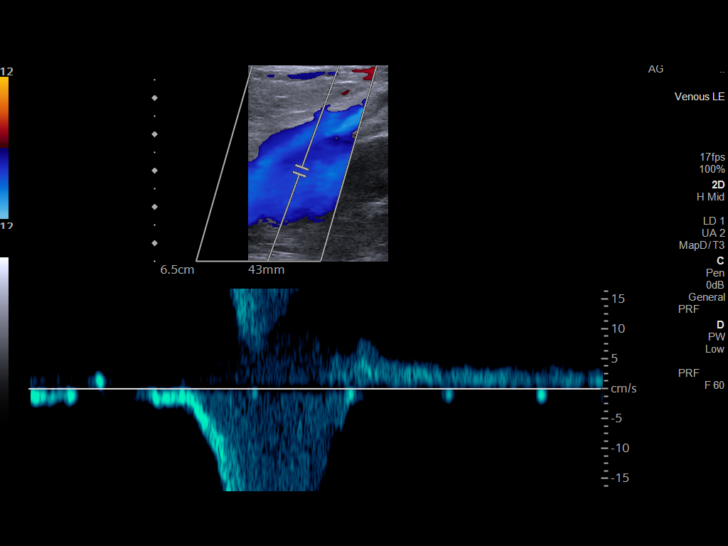
[im 31/40]
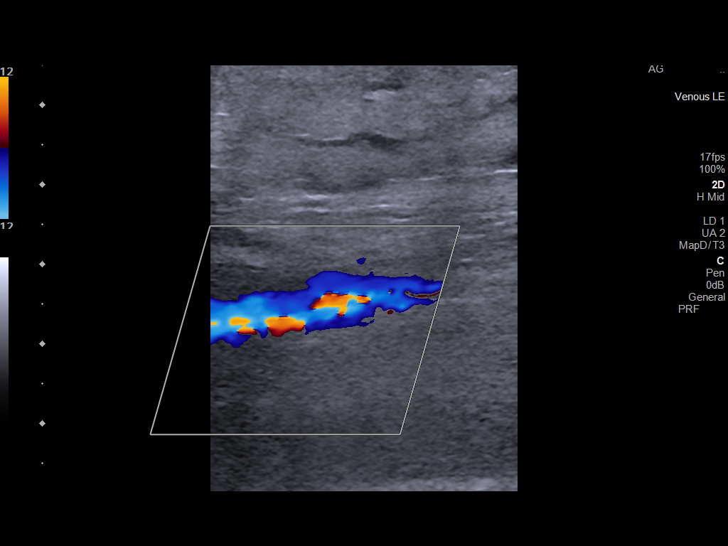
[im 33/40]
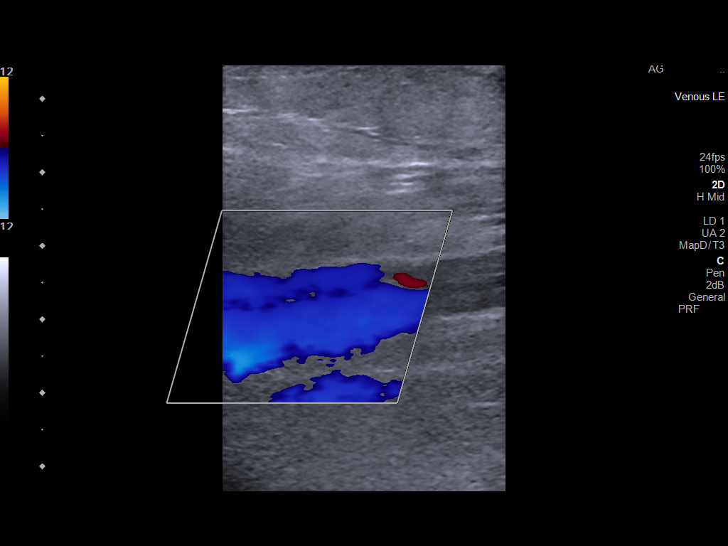
[im 36/40]
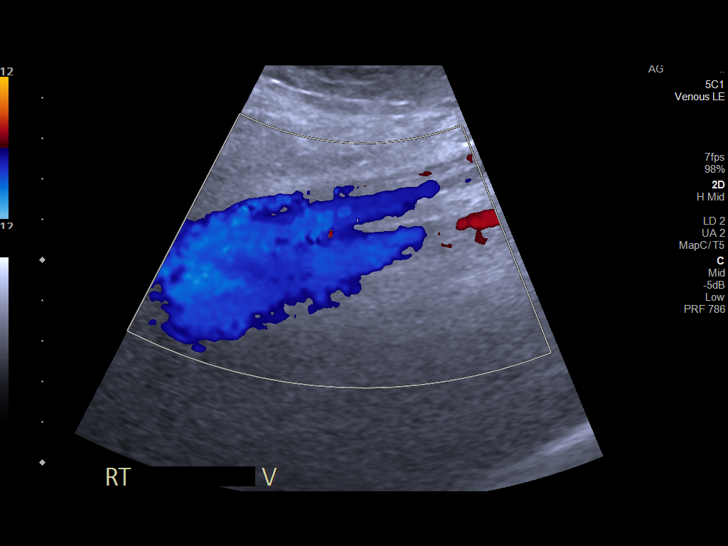
[im 40/40]
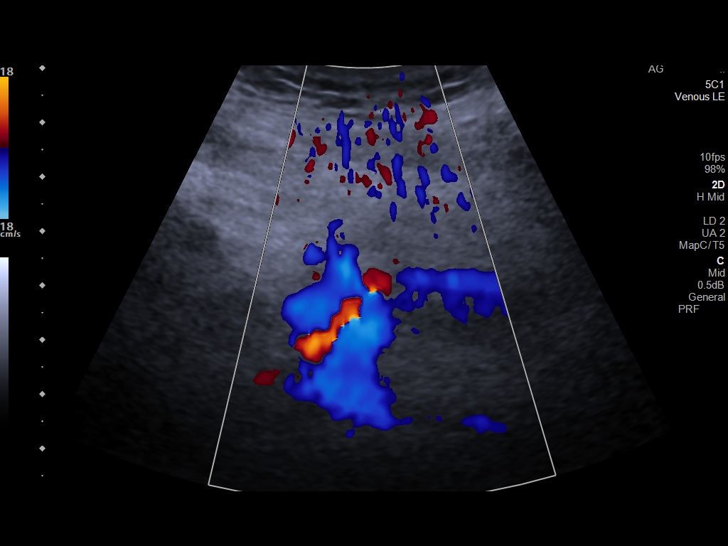

[14 of 24 positions shown; findings below may reference images not displayed]

FINDINGS: VENOUS

Normal compressibility of the common femoral, superficial femoral,
and popliteal veins, as well as the visualized calf veins.
Visualized portions of profunda femoral vein and great saphenous
vein unremarkable. No filling defects to suggest DVT on grayscale or
color Doppler imaging. Doppler waveforms show normal direction of
venous flow, normal respiratory plasticity and response to
augmentation.

Limited views of the contralateral common femoral vein are
unremarkable.

OTHER

None.

Limitations: Patient body habitus.
IMPRESSION: Negative.

## 2022-01-13 DIAGNOSIS — G4733 Obstructive sleep apnea (adult) (pediatric): Secondary | ICD-10-CM | POA: Diagnosis not present

## 2022-02-03 DIAGNOSIS — Z6841 Body Mass Index (BMI) 40.0 and over, adult: Secondary | ICD-10-CM | POA: Diagnosis not present

## 2022-02-03 DIAGNOSIS — Z125 Encounter for screening for malignant neoplasm of prostate: Secondary | ICD-10-CM | POA: Diagnosis not present

## 2022-02-03 DIAGNOSIS — Z713 Dietary counseling and surveillance: Secondary | ICD-10-CM | POA: Diagnosis not present

## 2022-02-03 DIAGNOSIS — Z79899 Other long term (current) drug therapy: Secondary | ICD-10-CM | POA: Diagnosis not present

## 2022-02-03 DIAGNOSIS — Z0000 Encounter for general adult medical examination without abnormal findings: Secondary | ICD-10-CM | POA: Diagnosis not present

## 2022-02-13 DIAGNOSIS — G4733 Obstructive sleep apnea (adult) (pediatric): Secondary | ICD-10-CM | POA: Diagnosis not present

## 2022-03-16 DIAGNOSIS — G4733 Obstructive sleep apnea (adult) (pediatric): Secondary | ICD-10-CM | POA: Diagnosis not present

## 2022-04-17 ENCOUNTER — Encounter (INDEPENDENT_AMBULATORY_CARE_PROVIDER_SITE_OTHER): Payer: Self-pay

## 2023-02-06 ENCOUNTER — Other Ambulatory Visit (HOSPITAL_COMMUNITY): Payer: Self-pay | Admitting: Nurse Practitioner

## 2023-02-06 DIAGNOSIS — Z136 Encounter for screening for cardiovascular disorders: Secondary | ICD-10-CM

## 2023-03-09 ENCOUNTER — Ambulatory Visit (HOSPITAL_COMMUNITY)
Admission: RE | Admit: 2023-03-09 | Discharge: 2023-03-09 | Disposition: A | Discharge status: Home / Self Care | Payer: BC Managed Care – PPO | Source: Ambulatory Visit | Attending: Nurse Practitioner | Admitting: Nurse Practitioner

## 2023-03-09 DIAGNOSIS — Z136 Encounter for screening for cardiovascular disorders: Secondary | ICD-10-CM | POA: Insufficient documentation
